# Patient Record
Sex: Female | Born: 1950 | Hispanic: No | Marital: Married | Smoking: Never smoker
Health system: Southern US, Community
[De-identification: ages and names within clinical notes are randomized; demographics above are authoritative.]

## PROBLEM LIST (undated history)

## (undated) DIAGNOSIS — E039 Hypothyroidism, unspecified: Secondary | ICD-10-CM

---

## 2016-08-12 ENCOUNTER — Emergency Department: Payer: Self-pay

## 2016-08-12 ENCOUNTER — Emergency Department
Admission: EM | Admit: 2016-08-12 | Discharge: 2016-08-12 | Disposition: A | Discharge status: Home / Self Care | Payer: Self-pay | Attending: Emergency Medicine | Admitting: Emergency Medicine

## 2016-08-12 ENCOUNTER — Encounter: Payer: Self-pay | Admitting: Emergency Medicine

## 2016-08-12 DIAGNOSIS — S0031XA Abrasion of nose, initial encounter: Secondary | ICD-10-CM | POA: Insufficient documentation

## 2016-08-12 DIAGNOSIS — E039 Hypothyroidism, unspecified: Secondary | ICD-10-CM | POA: Insufficient documentation

## 2016-08-12 DIAGNOSIS — Y929 Unspecified place or not applicable: Secondary | ICD-10-CM | POA: Insufficient documentation

## 2016-08-12 DIAGNOSIS — Y9389 Activity, other specified: Secondary | ICD-10-CM | POA: Insufficient documentation

## 2016-08-12 DIAGNOSIS — W109XXA Fall (on) (from) unspecified stairs and steps, initial encounter: Secondary | ICD-10-CM | POA: Insufficient documentation

## 2016-08-12 DIAGNOSIS — Y999 Unspecified external cause status: Secondary | ICD-10-CM | POA: Insufficient documentation

## 2016-08-12 DIAGNOSIS — S0081XA Abrasion of other part of head, initial encounter: Secondary | ICD-10-CM | POA: Insufficient documentation

## 2016-08-12 DIAGNOSIS — Z23 Encounter for immunization: Secondary | ICD-10-CM | POA: Insufficient documentation

## 2016-08-12 DIAGNOSIS — S52501A Unspecified fracture of the lower end of right radius, initial encounter for closed fracture: Secondary | ICD-10-CM | POA: Insufficient documentation

## 2016-08-12 HISTORY — DX: Hypothyroidism, unspecified: E03.9

## 2016-08-12 MED ORDER — NAPROXEN 500 MG PO TBEC: 500.0000 mg | DELAYED_RELEASE_TABLET | Freq: Two times a day (BID) | ORAL | 0 refills | Status: DC

## 2016-08-12 MED ORDER — CELECOXIB 200 MG PO CAPS: 200.0000 mg | ORAL_CAPSULE | Freq: Every day | ORAL | 2 refills | Status: AC

## 2016-08-12 MED ORDER — TETANUS-DIPHTH-ACELL PERTUSSIS 5-2.5-18.5 LF-MCG/0.5 IM SUSP
0.5000 mL | Freq: Once | INTRAMUSCULAR | Status: AC
  Administered 2016-08-12: 0.5 mL via INTRAMUSCULAR
  Filled 2016-08-12: qty 0.5

## 2016-08-12 MED ORDER — OXYCODONE HCL 5 MG PO TABS: 5.0000 mg | ORAL_TABLET | Freq: Three times a day (TID) | ORAL | 0 refills | Status: AC | PRN

## 2016-08-12 MED ORDER — KETOROLAC TROMETHAMINE 30 MG/ML IJ SOLN
30.0000 mg | Freq: Once | INTRAMUSCULAR | Status: AC
  Administered 2016-08-12: 30 mg via INTRAMUSCULAR
  Filled 2016-08-12: qty 1

## 2016-08-12 NOTE — ED Triage Notes (Addendum)
Patient reports tripping on shoe while going down the stairs this evening. Hit face on steps. Abrasion and laceration noted to forehead and nose. Denies LOC. Denies dizziness or vision changes. Patient also complaining of pain to left wrist and arm. States she caught herself on that arm when she fell.

## 2016-08-12 NOTE — ED Provider Notes (Signed)
[facility] Emergency Department Provider Note ____________________________________________  Time seen: Approximately 11:54 PM  I have reviewed the triage vital signs and the nursing notes.   HISTORY  Chief Complaint Fall    HPI Alicia Rowland is a 65 y.o. female that tripped today while descending stairs. She hit her forehead during the fall. She did not lose consciousness and she does not report having a headache. She has abrasions localized to the forehead and the nose. She denies dizziness, nausea, vomiting and neck pain. She reports pain to the left wrist and forearm. Patient states that during the fall she used left arm to "break the fall". Patient states that her pain scale is currently 0/10. Patient is unsure about tetanus status. Patient denies having prior surgeries to the left upper extremity. She is right-hand dominant. Patient has not attempted alleviating measures.  Past Medical History:  Diagnosis Date  . Hypothyroid     There are no active problems to display for this patient.   History reviewed. No pertinent surgical history.  Prior to Admission medications   Medication Sig Start Date End Date Taking? Authorizing Provider  celecoxib (CELEBREX) 200 MG capsule Take 1 capsule (200 mg total) by mouth daily. 08/12/16 08/12/17  Orvil FeilJaclyn M Woods, PA-C  oxyCODONE (ROXICODONE) 5 MG immediate release tablet Take 1 tablet (5 mg total) by mouth every 8 (eight) hours as needed. 08/12/16 08/12/17  Orvil FeilJaclyn M Woods, PA-C    Allergies Patient has no known allergies.  No family history on file.  Social History Social History  Substance Use Topics  . Smoking status: Never Smoker  . Smokeless tobacco: Never Used  . Alcohol use No    Review of Systems Constitutional: No recent illness. Cardiovascular: Denies chest pain or palpitations. Respiratory: Denies shortness of breath. Musculoskeletal: Pain in left wrist and forearm. Skin: Has forehead and nose  abrasions. Neurological: Negative for focal weakness or numbness.  ____________________________________________   PHYSICAL EXAM:  VITAL SIGNS: ED Triage Vitals  Enc Vitals Group     BP 08/12/16 2032 (!) 159/85     Pulse Rate 08/12/16 2032 100     Resp 08/12/16 2032 16     Temp 08/12/16 2032 98.3 F (36.8 C)     Temp Source 08/12/16 2032 Oral     SpO2 08/12/16 2032 99 %     Weight 08/12/16 2033 220 lb (99.8 kg)     Height 08/12/16 2033 5\' 1"  (1.549 m)     Head Circumference --      Peak Flow --      Pain Score 08/12/16 2033 7     Pain Loc --      Pain Edu? --      Excl. in GC? --     Constitutional: Alert and oriented. Well appearing and in no acute distress. Eyes: Conjunctivae are normal. EOMI. Accomodation is within normal range. Pupils are equal round and reactive to light bilaterally. Head: Atraumatic.Patient's tympanic membranes are visualized without evidence of bloody effusion. Nose: Nasal septum is midline. No evidence of hematoma formation. Neck: Patient is able to demonstrate full range of motion. No tenderness to palpation along the C-spine. Respiratory: Normal respiratory effort.   Musculoskeletal: Patient has full range of motion at the left shoulder. Patient's left wrist is edematous. She is not able to perform flexion or extension at the left wrist. She is not able to perform supination or pronation at the left wrist. Patient is tender to palpation along the distal radius.  Patient's reflexes are 2+ and symmetrical. Neurologic: Normal speech and language. No gross focal neurologic deficits are appreciated. Cranial nerves: 2-10 normal as tested. Cerebellar: Finger-nose-finger WNL Vision: No visual field deficts noted to confrontation.  Speech: No dysarthria or expressive aphasia.  Skin:  Patient has abrasions localized to the forehead and nose. Skin overlying the left forearm is warm. Radial and ulnar pulses are palpable. Psychiatric: Mood and affect are normal.  Speech and behavior are normal.  ____________________________________________   LABS (all labs ordered are listed, but only abnormal results are displayed)  Labs Reviewed - No data to display ____________________________________________  RADIOLOGY  IMPRESSION:  Impacted distal radius fracture with a mild dorsal tilt of the  radiocarpal joint. No definite intra-articular extension of the  fracture.    PROCEDURES  Procedure(s) performed:  Toradol  Tdap   ____________________________________________   INITIAL IMPRESSION / ASSESSMENT AND PLAN / ED COURSE  Clinical Course     Pertinent labs & imaging results that were available during my care of the patient were reviewed by me and considered in my medical decision making (see chart for details).  Assessment and plan: I consulted with Amador Cunashomas Gaines, PA-C who works primarily in orthopedics. Mr. Floyce StakesGaines advised immobilization and referral to orthopedics to be seen on Monday morning. Left forearm was immobilized in a sugar tong splint. I referred patient to Dr. Rosita KeaMenz, the orthopedist on-call. Patient was advised to make an appointment for Monday morning. Patient was discharged on Roxicodone and Celebrex to be used as needed for pain and inflammation. All patient questions were answered. Strict return precautions were given.  Patient does not report headache. She denies nausea and vomiting. I do not suspect subdural hematoma or traumatic brain injury. A CT head was not warranted at this time. ____________________________________________   FINAL CLINICAL IMPRESSION(S) / ED DIAGNOSES  Final diagnoses:  Closed fracture of distal end of right radius, unspecified fracture morphology, initial encounter       Orvil FeilJaclyn M Woods, PA-C 08/13/16 0010    Phineas SemenGraydon Goodman, MD 08/14/16 [phone]

## 2017-09-25 IMAGING — DX DG WRIST COMPLETE 3+V*L*
4 series · 4 of 4 positions shown · non-contrast
Comparison: Forearm radiograph August 12, 2016 at 3538 hours

CLINICAL DATA: Fell down steps this afternoon. Follow-up radial
fracture.

EXAM:
LEFT WRIST - COMPLETE 3+ VIEW

[wrist ap (1 of 2)]
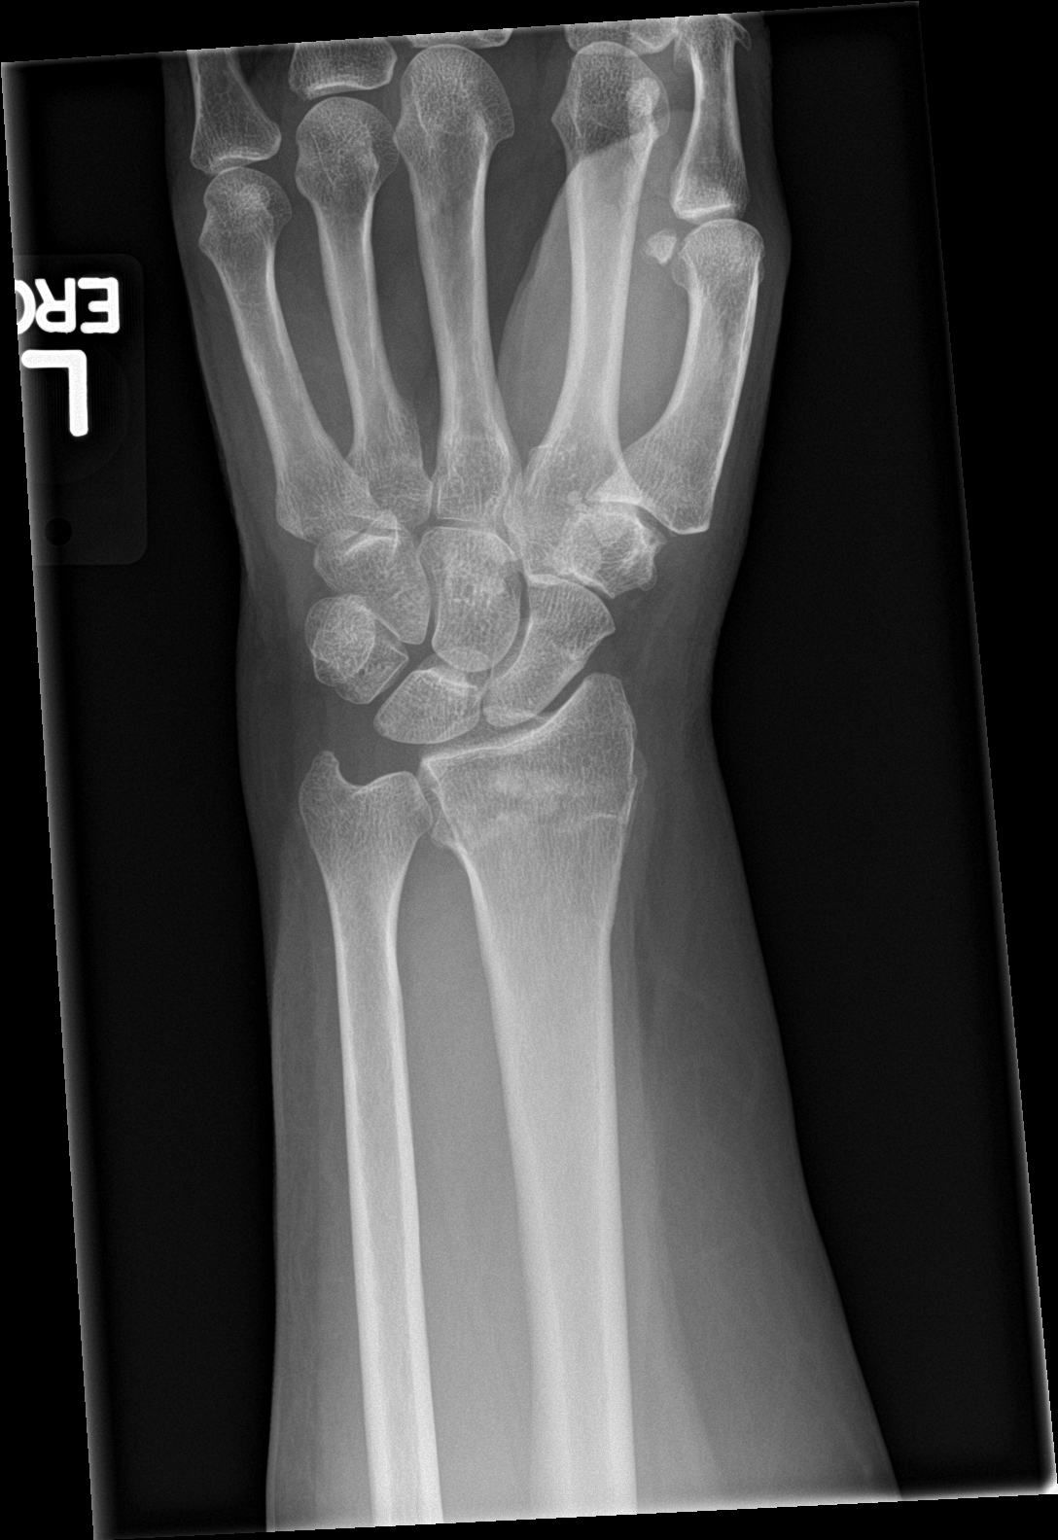

[wrist obl]
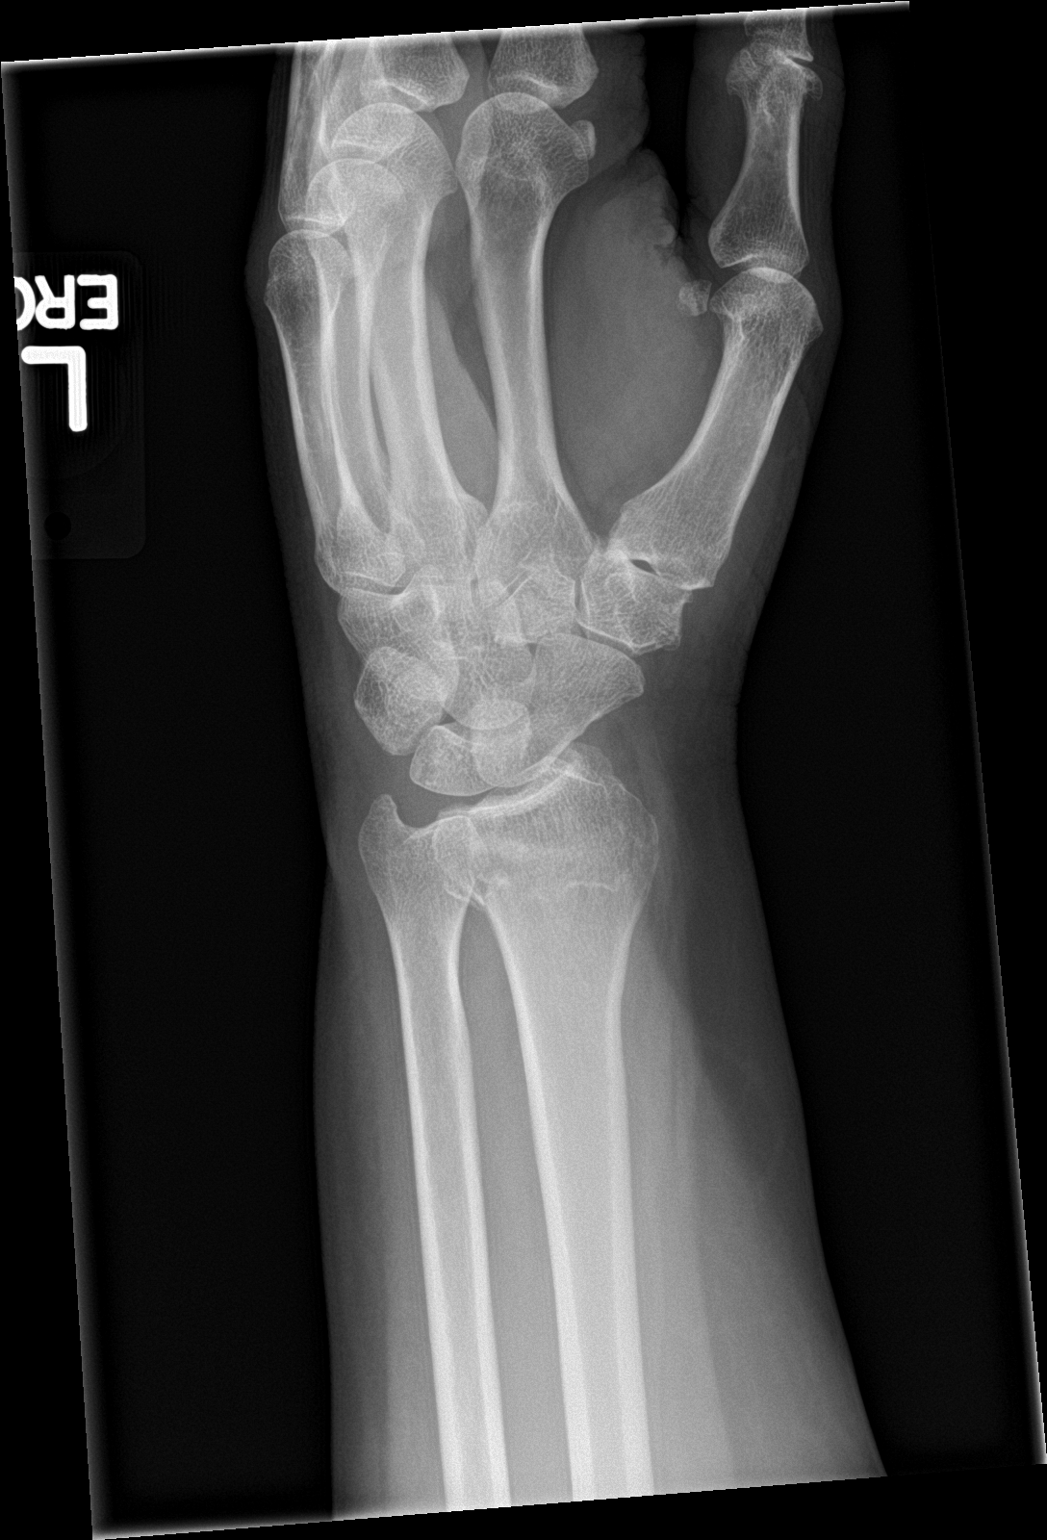

[wrist lat]
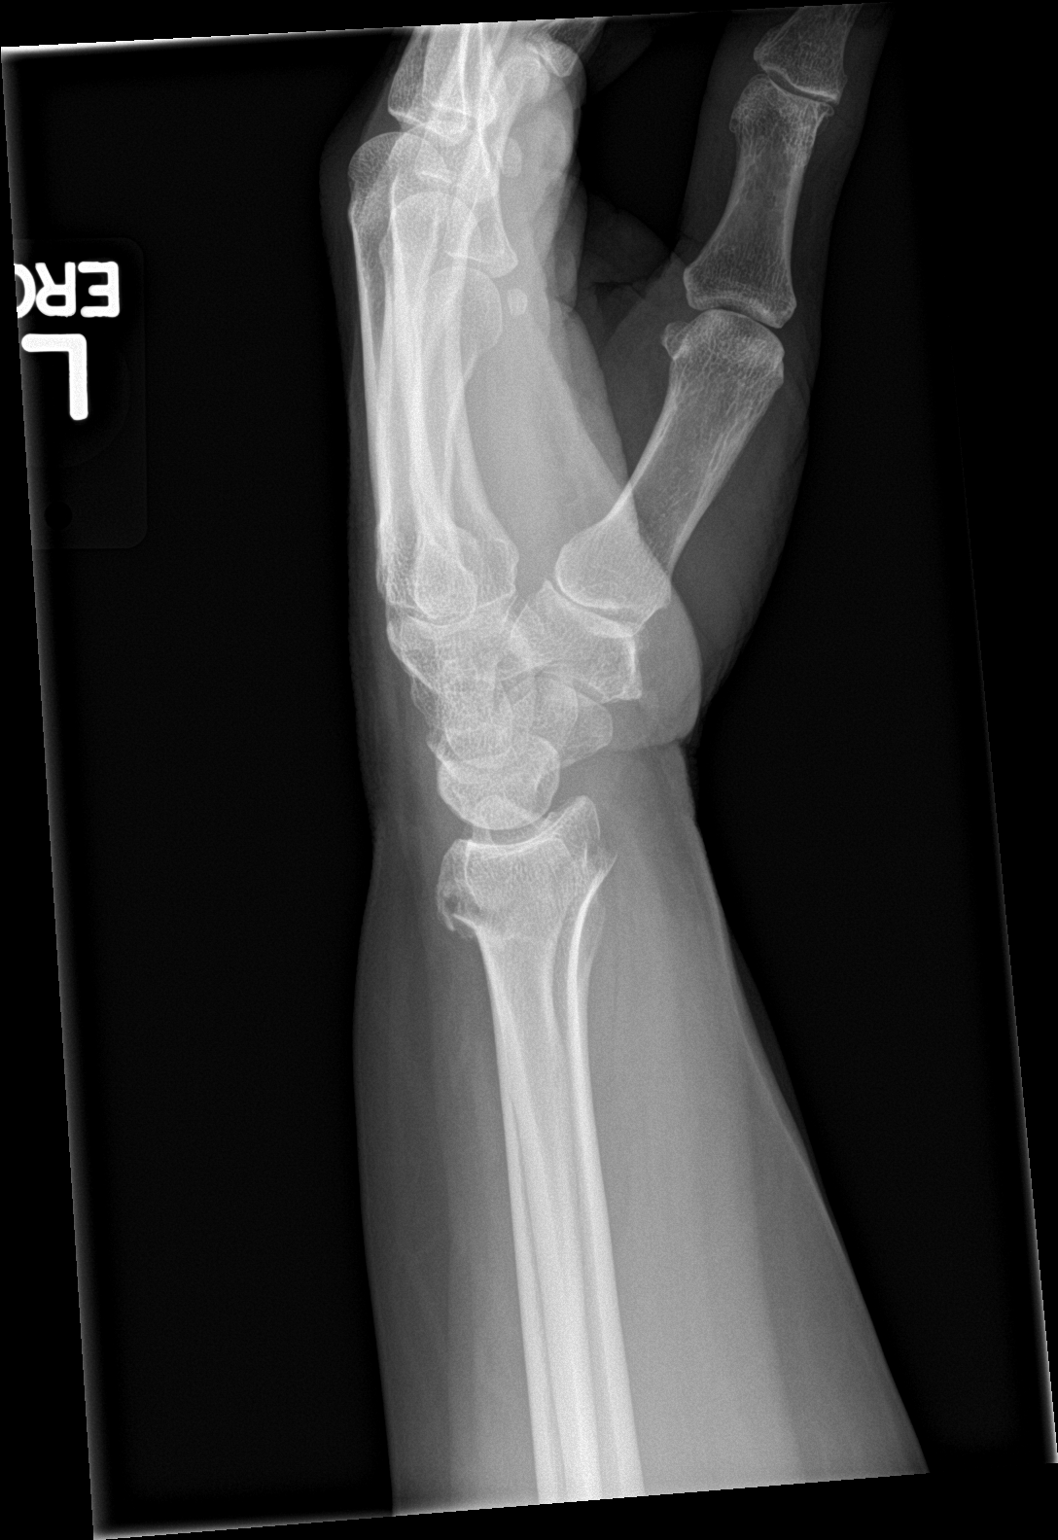

[wrist ap (2 of 2)]
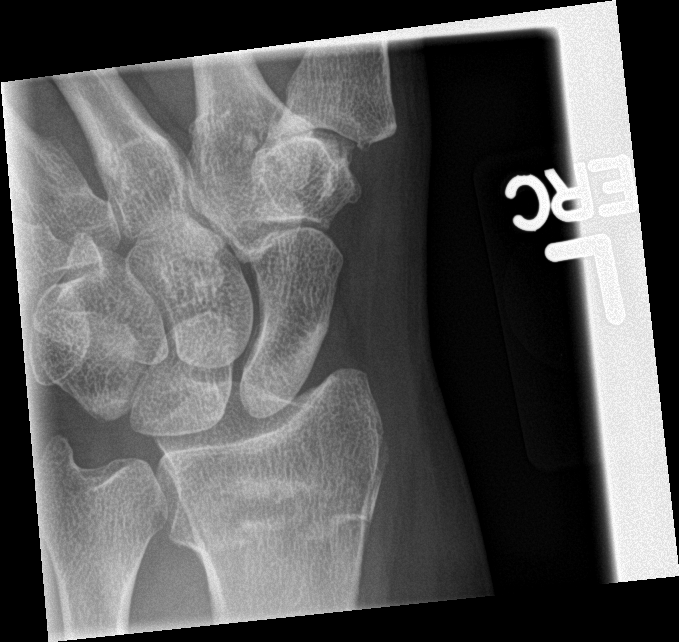

[4 of 4 positions shown; findings below may reference images not displayed]

FINDINGS: Acute comminuted distal radial fracture with dorsal angulation of
the distal bony fragments. No definite intra-articular extension. No
dislocation. No destructive bony lesions. Dorsal wrist soft tissue
swelling without subcutaneous gas radiopaque foreign bodies.
IMPRESSION: Acute displaced distal radial fracture.
